# Patient Record
Sex: Male | Born: 1980 | Race: Black or African American | Hispanic: No | Marital: Married | Smoking: Current every day smoker
Health system: Southern US, Community
[De-identification: ages and names within clinical notes are randomized; demographics above are authoritative.]

## PROBLEM LIST (undated history)

## (undated) ENCOUNTER — Ambulatory Visit

---

## 2015-01-13 ENCOUNTER — Emergency Department (HOSPITAL_COMMUNITY)
Admission: EM | Admit: 2015-01-13 | Discharge: 2015-01-13 | Disposition: A | Discharge status: Home / Self Care | Payer: BLUE CROSS/BLUE SHIELD | Attending: Emergency Medicine | Admitting: Emergency Medicine

## 2015-01-13 ENCOUNTER — Encounter (HOSPITAL_COMMUNITY): Payer: Self-pay | Admitting: Emergency Medicine

## 2015-01-13 DIAGNOSIS — K029 Dental caries, unspecified: Secondary | ICD-10-CM | POA: Diagnosis not present

## 2015-01-13 DIAGNOSIS — Z720 Tobacco use: Secondary | ICD-10-CM | POA: Insufficient documentation

## 2015-01-13 DIAGNOSIS — K088 Other specified disorders of teeth and supporting structures: Secondary | ICD-10-CM | POA: Insufficient documentation

## 2015-01-13 DIAGNOSIS — K047 Periapical abscess without sinus: Secondary | ICD-10-CM

## 2015-01-13 MED ORDER — TRAMADOL HCL 50 MG PO TABS: 50.0000 mg | ORAL_TABLET | Freq: Four times a day (QID) | ORAL | Status: DC | PRN

## 2015-01-13 MED ORDER — PENICILLIN V POTASSIUM 500 MG PO TABS
500.0000 mg | ORAL_TABLET | Freq: Once | ORAL | Status: AC
  Administered 2015-01-13: 500 mg via ORAL
  Filled 2015-01-13: qty 1

## 2015-01-13 MED ORDER — PENICILLIN V POTASSIUM 500 MG PO TABS: 500.0000 mg | ORAL_TABLET | Freq: Four times a day (QID) | ORAL | Status: AC

## 2015-01-13 NOTE — ED Notes (Signed)
Pt states he had an abscess to L upper tooth that "burst" earlier today, drained white pus. Pt c/o pain to the area.

## 2015-01-13 NOTE — Discharge Instructions (Signed)
Abscessed Tooth An abscessed tooth is an infection around your tooth. It may be caused by holes or damage to the tooth (cavity) or a dental disease. An abscessed tooth causes mild to very bad pain in and around the tooth. See your dentist right away if you have tooth or gum pain. HOME CARE  Take your medicine as told. Finish it even if you start to feel better.  Do not drive after taking pain medicine.  Rinse your mouth (gargle) often with salt water ( teaspoon salt in 8 ounces of warm water).  Do not apply heat to the outside of your face. GET HELP RIGHT AWAY IF:   You have a temperature by mouth above 102 F (38.9 C), not controlled by medicine.  You have chills and a very bad headache.  You have problems breathing or swallowing.  Your mouth will not open.  You develop puffiness (swelling) on the neck or around the eye.  Your pain is not helped by medicine.  Your pain is getting worse instead of better. MAKE SURE YOU:   Understand these instructions.  Will watch your condition.  Will get help right away if you are not doing well or get worse. Document Released: 04/15/2008 Document Revised: 01/20/2012 Document Reviewed: 02/05/2011 [facility] Patient Information 2015 [place], [place]. This information is not intended to replace advice given to you by your health care provider. Make sure you discuss any questions you have with your health care provider.  Dental Care and Dentist Visits Dental care supports good overall health. Regular dental visits can also help you avoid dental pain, bleeding, infection, and other more serious health problems in the future. It is important to keep the mouth healthy because diseases in the teeth, gums, and other oral tissues can spread to other areas of the body. Some problems, such as diabetes, heart disease, and pre-term labor have been associated with poor oral health.  See your dentist every 6 months. If you experience emergency problems such  as a toothache or broken tooth, go to the dentist right away. If you see your dentist regularly, you may catch problems early. It is easier to be treated for problems in the early stages.  WHAT TO EXPECT AT A DENTIST VISIT  Your dentist will look for many common oral health problems and recommend proper treatment. At your regular dental visit, you can expect:  Gentle cleaning of the teeth and gums. This includes scraping and polishing. This helps to remove the sticky substance around the teeth and gums (plaque). Plaque forms in the mouth shortly after eating. Over time, plaque hardens on the teeth as tartar. If tartar is not removed regularly, it can cause problems. Cleaning also helps remove stains.  Periodic X-rays. These pictures of the teeth and supporting bone will help your dentist assess the health of your teeth.  Periodic fluoride treatments. Fluoride is a natural mineral shown to help strengthen teeth. Fluoride treatmentinvolves applying a fluoride gel or varnish to the teeth. It is most commonly done in children.  Examination of the mouth, tongue, jaws, teeth, and gums to look for any oral health problems, such as:  Cavities (dental caries). This is decay on the tooth caused by plaque, sugar, and acid in the mouth. It is best to catch a cavity when it is small.  Inflammation of the gums caused by plaque buildup (gingivitis).  Problems with the mouth or malformed or misaligned teeth.  Oral cancer or other diseases of the soft tissues or jaws.  KEEP YOUR TEETH AND GUMS HEALTHY For healthy teeth and gums, follow these general guidelines as well as your dentist's specific advice:  Have your teeth professionally cleaned at the dentist every 6 months.  Brush twice daily with a fluoride toothpaste.  Floss your teeth daily.  Ask your dentist if you need fluoride supplements, treatments, or fluoride toothpaste.  Eat a healthy diet. Reduce foods and drinks with added sugar.  Avoid  smoking. TREATMENT FOR ORAL HEALTH PROBLEMS If you have oral health problems, treatment varies depending on the conditions present in your teeth and gums.  Your caregiver will most likely recommend good oral hygiene at each visit.  For cavities, gingivitis, or other oral health disease, your caregiver will perform a procedure to treat the problem. This is typically done at a separate appointment. Sometimes your caregiver will refer you to another dental specialist for specific tooth problems or for surgery. SEEK IMMEDIATE DENTAL CARE IF:  You have pain, bleeding, or soreness in the gum, tooth, jaw, or mouth area.  A permanent tooth becomes loose or separated from the gum socket.  You experience a blow or injury to the mouth or jaw area. Document Released: 07/10/2011 Document Revised: 01/20/2012 Document Reviewed: 07/10/2011 [facility] Patient Information 2015 [place]. This information is not intended to replace advice given to you by your health care provider. Make sure you discuss any questions you have with your health care provider. It is important that you call Dr. Stefani DamaBenitez's office first thing Monday morning, telling them you referred to the emergency department.  I will make every effort to see you within 24 hours of you call

## 2015-01-13 NOTE — ED Provider Notes (Signed)
CSN: [id number]     Arrival date & time 01/13/15  1913 History  This chart was scribed for non-physician practitioner Earley FavorGail Jearldean Gutt NP working with Toy CookeyMegan Docherty, MD by Conchita ParisNadim Abuhashem, ED Scribe. This patient was seen in [facility] and the patient's care was started at 8:12 PM.   Chief Complaint  Patient presents with  . Dental Pain   HPI  HPI Comments: Rodney Hendricks is a 34 y.o. male who presents to the Emergency Department complaining of dental pain ongoing for 3 months in both upper first molars. He woke this morning with an abscess which he drained it himself. He has recently gotten health insurance but has not had made an appointment with a dentist. He is not allergic to any medication.  History reviewed. No pertinent past medical history. History reviewed. No pertinent past surgical history. No family history on file. History  Substance Use Topics  . Smoking status: Current Every Day Smoker -- 0.50 packs/day    Types: Cigarettes  . Smokeless tobacco: Not on file  . Alcohol Use: No    Review of Systems  Constitutional: Negative for fever.  HENT: Positive for dental problem.       Allergies  Review of patient's allergies indicates no known allergies.  Home Medications   Prior to Admission medications   Not on File   BP 146/80 mmHg  Pulse 85  Temp(Src) 98 F (36.7 C) (Oral)  Resp 16  Ht 5\' 11"  (1.803 m)  Wt 168 lb (76.204 kg)  BMI 23.44 kg/m2  SpO2 98% Physical Exam  Constitutional: He appears well-developed and well-nourished.  HENT:  Mouth/Throat: Oropharynx is clear and moist.    Eyes: Pupils are equal, round, and reactive to light.  Neck: Normal range of motion.  Cardiovascular: Normal rate.   Pulmonary/Chest: Effort normal.  Musculoskeletal: Normal range of motion.  Skin: Skin is warm and dry.    ED Course  Procedures  DIAGNOSTIC STUDIES: Oxygen Saturation is 98% on room air, normal by my interpretation.    COORDINATION OF CARE: 8:13 PM  Discussed treatment plan with pt at bedside and pt agreed to plan.  Labs Review Labs Reviewed - No data to display  Imaging Review No results found.   EKG Interpretation None     No facial swelling.  Slight erythema of gum surrounding left upper first molar.  No drainage noted.  Minimal tenderness on palpation.  Start patient on penicillin, Ultram for pain.  Referred to dentist on call MDM   Final diagnoses:  None    I personally performed the services described in this documentation, which was scribed in my presence. The recorded information has been reviewed and is accurate.    Arman FilterGail K Bellarose Burtt, NP 01/13/15 [id number]  Toy CookeyMegan Docherty, MD 01/14/15 [id number]

## 2017-11-18 ENCOUNTER — Encounter (HOSPITAL_COMMUNITY): Payer: Self-pay | Admitting: Emergency Medicine

## 2017-11-18 ENCOUNTER — Emergency Department (HOSPITAL_COMMUNITY)
Admission: EM | Admit: 2017-11-18 | Discharge: 2017-11-18 | Disposition: A | Discharge status: Home / Self Care | Payer: Self-pay | Attending: Emergency Medicine | Admitting: Emergency Medicine

## 2017-11-18 ENCOUNTER — Emergency Department (HOSPITAL_COMMUNITY): Payer: Self-pay

## 2017-11-18 DIAGNOSIS — F17210 Nicotine dependence, cigarettes, uncomplicated: Secondary | ICD-10-CM | POA: Insufficient documentation

## 2017-11-18 DIAGNOSIS — X503XXA Overexertion from repetitive movements, initial encounter: Secondary | ICD-10-CM | POA: Insufficient documentation

## 2017-11-18 DIAGNOSIS — Y9289 Other specified places as the place of occurrence of the external cause: Secondary | ICD-10-CM | POA: Insufficient documentation

## 2017-11-18 DIAGNOSIS — Y9389 Activity, other specified: Secondary | ICD-10-CM | POA: Insufficient documentation

## 2017-11-18 DIAGNOSIS — M25562 Pain in left knee: Secondary | ICD-10-CM | POA: Insufficient documentation

## 2017-11-18 DIAGNOSIS — Y990 Civilian activity done for income or pay: Secondary | ICD-10-CM | POA: Insufficient documentation

## 2017-11-18 MED ORDER — TRAMADOL HCL 50 MG PO TABS: 50.0000 mg | ORAL_TABLET | Freq: Two times a day (BID) | ORAL | 0 refills | Status: DC | PRN

## 2017-11-18 NOTE — ED Provider Notes (Signed)
[name]-EMERGENCY DEPT Provider Note   CSN: [id number] Arrival date & time: 11/18/17  1302     History   Chief Complaint Chief Complaint  Patient presents with  . Knee Pain    left    HPI Rodney Hendricks is a 37 y.o. male presents with acute onset, constant left knee pain  for 5 days.  Patient states that he was at work lifting a box off of a truck while standing on a metal grate.  He states that when he bent down he felt his left knee gave out resulting in acute onset of pain.  Pain is primarily localized to the knee And lateral aspect of the left knee with occasional radiation to the back.  He denies numbness, tingling, or weakness.  He has tried ice, heat, and ibuprofen without significant relief of his symptoms.  He denies head injury or loss of consciousness.  He states he feels that he aggravated his pain even more last night while he was at work.  He states that he works 2 jobs and does a lot of heavy lifting and is on his feet all day.  The history is provided by the patient.    History reviewed. No pertinent past medical history.  There are no active problems to display for this patient.   History reviewed. No pertinent surgical history.     Home Medications    Prior to Admission medications   Medication Sig Start Date End Date Taking? Authorizing Provider  traMADol (ULTRAM) 50 MG tablet Take 1 tablet (50 mg total) by mouth every 12 (twelve) hours as needed for severe pain. 11/18/17   Jeanie SewerFawze, Berneice Zettlemoyer A, PA-C    Family History No family history on file.  Social History Social History   Tobacco Use  . Smoking status: Current Every Day Smoker    Packs/day: 0.50    Types: Cigarettes  . Smokeless tobacco: Never Used  Substance Use Topics  . Alcohol use: No  . Drug use: No     Allergies   Patient has no known allergies.   Review of Systems Review of Systems  Constitutional: Negative for chills and fever.  Musculoskeletal: Positive  for arthralgias (L knee).  Neurological: Negative for syncope, weakness, numbness and headaches.     Physical Exam Updated Vital Signs BP 139/73 (BP Location: Right Arm)   Pulse 85   Temp 98.9 F (37.2 C) (Oral)   Resp 16   SpO2 100%   Physical Exam  Constitutional: He is oriented to person, place, and time. He appears well-developed and well-nourished. No distress.  HENT:  Head: Normocephalic and atraumatic.  Eyes: Conjunctivae are normal. Right eye exhibits no discharge. Left eye exhibits no discharge.  Neck: No JVD present. No tracheal deviation present.  Cardiovascular: Normal rate and intact distal pulses.  2+ DP/PT pulses bilaterally, no lower extremity edema  Pulmonary/Chest: Effort normal.  Abdominal: He exhibits no distension.  Musculoskeletal: He exhibits tenderness. He exhibits no edema.       Right knee: Normal.       Left knee: He exhibits decreased range of motion and bony tenderness. He exhibits no swelling, no effusion, no ecchymosis, no deformity, no laceration, no erythema, normal alignment, no LCL laxity, normal patellar mobility, normal meniscus and no MCL laxity. Tenderness found. Medial joint line, lateral joint line and LCL tenderness noted.       Right ankle: Normal.       Left ankle: Normal.  Lumbar back: Normal.  Slightly decreased range of motion with flexion of the left knee secondary to pain.  Able to extend the left knee against gravity without difficulty. negative anterior/posterior drawer test.  No varus or valgus instability.  5/5 strength of BLE major muscle groups. No midline spine TTP, no paraspinal muscle tenderness, no deformity, crepitus, or step-off noted.   Neurological: He is alert and oriented to person, place, and time. No cranial nerve deficit or sensory deficit. He exhibits normal muscle tone.  Fluent speech, no facial droop, sensation intact to soft touch of bilateral lower extremities.  Antalgic gait due to left knee pain but able  to Heel Walk and Toe Walk without difficulty  Skin: Skin is warm and dry. No erythema.  Psychiatric: He has a normal mood and affect. His behavior is normal.  Nursing note and vitals reviewed.    ED Treatments / Results  Labs (all labs ordered are listed, but only abnormal results are displayed) Labs Reviewed - No data to display  EKG  EKG Interpretation None       Radiology Dg Knee Complete 4 Views Left  Result Date: 11/18/2017 CLINICAL DATA:  Knee pain EXAM: LEFT KNEE - COMPLETE 4+ VIEW COMPARISON:  None. FINDINGS: No evidence of fracture, dislocation, or joint effusion. No evidence of arthropathy or other focal bone abnormality. Soft tissues are unremarkable. IMPRESSION: Negative. Electronically Signed   By: Marlan Palau M.D.   On: 11/18/2017 14:18    Procedures Procedures (including critical care time)  Medications Ordered in ED Medications - No data to display   Initial Impression / Assessment and Plan / ED Course  I have reviewed the triage vital signs and the nursing notes.  Pertinent labs & imaging results that were available during my care of the patient were reviewed by me and considered in my medical decision making (see chart for details).     Patient with left knee pain secondary to injury 5 days ago at work.  No head injury or loss of consciousness.  Afebrile, vital signs are stable.  He is nontoxic in appearance.  He is neurovascularly intact.  He is ambulatory although it is somewhat painful.  No evidence of quadriceps tendon rupture.  No erythema or constitutional symptoms to suggest septic joint and he has good range of motion although somewhat painful with flexion.  Radiographs show no fracture or dislocation.  RICE therapy indicated and discussed with patient.  He will follow-up with his primary care physician or orthopedics for reevaluation of symptoms.  Will give a small prescription for tramadol for severe breakthrough pain and advised patient of the  appropriate use of this medication and that it may make him drowsy.  Discussed indications for return to the ED. Pt verbalized understanding of and agreement with plan and is safe for discharge home at this time.  He has no complaints prior to discharge.  Final Clinical Impressions(s) / ED Diagnoses   Final diagnoses:  Acute pain of left knee    ED Discharge Orders        Ordered    traMADol (ULTRAM) 50 MG tablet  Every 12 hours PRN     11/18/17 1424       Jeanie Sewer, PA-C 11/18/17 1644    Derwood Kaplan, MD 11/19/17 [phone]

## 2017-11-18 NOTE — ED Triage Notes (Signed)
Patient c/o left knee pain since Friday when working at [employer] and pulled load off truck. Patient c/o pain continued even with heat and ice.

## 2017-11-18 NOTE — Discharge Instructions (Signed)
1. Medications: Alternate 600 mg of ibuprofen and [phone] mg of Tylenol every 3 hours as needed for pain. Do not exceed 4000 mg of Tylenol daily.  You may take tramadol as needed for severe pain but do not drive, drink alcohol, or operate heavy machinery on this medication as it may make you drowsy. 2. Treatment: rest, ice, elevate and use brace and crutches for comfort, drink plenty of fluids, gentle stretching 3. Follow Up: Please followup with orthopedics as directed or your PCP in 1 week if no improvement for discussion of your diagnoses and further evaluation after today's visit; if you do not have a primary care doctor use the resource guide provided to find one; Please return to the ER for worsening symptoms or other concerns

## 2018-03-12 ENCOUNTER — Encounter (HOSPITAL_COMMUNITY): Payer: Self-pay

## 2018-03-12 ENCOUNTER — Emergency Department (HOSPITAL_COMMUNITY)
Admission: EM | Admit: 2018-03-12 | Discharge: 2018-03-12 | Disposition: A | Discharge status: Home / Self Care | Payer: Self-pay | Attending: Emergency Medicine | Admitting: Emergency Medicine

## 2018-03-12 ENCOUNTER — Other Ambulatory Visit: Payer: Self-pay

## 2018-03-12 DIAGNOSIS — K029 Dental caries, unspecified: Secondary | ICD-10-CM | POA: Insufficient documentation

## 2018-03-12 DIAGNOSIS — K0889 Other specified disorders of teeth and supporting structures: Secondary | ICD-10-CM | POA: Insufficient documentation

## 2018-03-12 DIAGNOSIS — F17210 Nicotine dependence, cigarettes, uncomplicated: Secondary | ICD-10-CM | POA: Insufficient documentation

## 2018-03-12 MED ORDER — MELOXICAM 15 MG PO TABS: 15.0000 mg | ORAL_TABLET | Freq: Every day | ORAL | 0 refills | Status: DC

## 2018-03-12 MED ORDER — PENICILLIN V POTASSIUM 500 MG PO TABS: 500.0000 mg | ORAL_TABLET | Freq: Four times a day (QID) | ORAL | 0 refills | Status: AC

## 2018-03-12 NOTE — ED Provider Notes (Signed)
[facility]-EMERGENCY DEPT Provider Note   CSN: [id number] Arrival date & time: 03/12/18  1604     History   Chief Complaint Chief Complaint  Patient presents with  . Dental Pain    HPI Rodney Hendricks is a 37 y.o. male who presents to ED for evaluation of 2-day history of left upper dental pain.  Describes the pain as sharp and radiates to the entire left side of his face.  He has tried Tylenol, NSAIDs, turmeric oil with no improvement in his symptoms.  Believes the pain began after eating a hard food which caused his tooth to be chipped.  He has not seen a dentist in 1 year.  He denies any trouble breathing or trouble swallowing, fevers, drainage or bleeding from site, drooling, trismus, shortness of breath, neck pain or rashes.  HPI  History reviewed. No pertinent past medical history.  There are no active problems to display for this patient.   History reviewed. No pertinent surgical history.      Home Medications    Prior to Admission medications   Medication Sig Start Date End Date Taking? Authorizing Provider  meloxicam (MOBIC) 15 MG tablet Take 1 tablet (15 mg total) by mouth daily. 03/12/18   Daelyn Pettaway, PA-C  penicillin v potassium (VEETID) 500 MG tablet Take 1 tablet (500 mg total) by mouth 4 (four) times daily for 7 days. 03/12/18 03/19/18  Jovoni Borkenhagen, PA-C  traMADol (ULTRAM) 50 MG tablet Take 1 tablet (50 mg total) by mouth every 12 (twelve) hours as needed for severe pain. 11/18/17   Jeanie Sewer, PA-C    Family History History reviewed. No pertinent family history.  Social History Social History   Tobacco Use  . Smoking status: Current Every Day Smoker    Packs/day: 0.50    Types: Cigarettes  . Smokeless tobacco: Never Used  Substance Use Topics  . Alcohol use: No  . Drug use: No     Allergies   Patient has no known allergies.   Review of Systems Review of Systems  Constitutional: Negative for chills and fever.  HENT:  Positive for dental problem. Negative for ear pain, facial swelling, mouth sores, rhinorrhea and sore throat.   Respiratory: Negative for shortness of breath.   Cardiovascular: Negative for chest pain.  Gastrointestinal: Negative for vomiting.     Physical Exam Updated Vital Signs BP 134/82 (BP Location: Left Arm)   Pulse 72   Temp 98.3 F (36.8 C) (Oral)   Resp 16   Ht  (1.753 m)   Wt 74.8 kg (165 lb)   SpO2 97%   BMI 24.37 kg/m   Physical Exam  Constitutional: He appears well-developed and well-nourished. No distress.  HENT:  Head: Normocephalic and atraumatic.  Nose: Nose normal.  Mouth/Throat: Uvula is midline and oropharynx is clear and moist. He does not have dentures. No oral lesions. No trismus in the jaw. Abnormal dentition. Dental caries present. No dental abscesses, uvula swelling or lacerations. No tonsillar exudate.    Overall poor dentition noted.  2 chipped teeth noted in the area.  No gross dental abscess or site of drainage at this time.  Mild inflammation of the gum noted. No facial, neck or cheek swelling noted. No pooling of secretions or trismus.  Normal voice noted with no difficulty swallowing or breathing.  No submandibular edema, erythema or crepitus noted.  Eyes: Conjunctivae and EOM are normal. No scleral icterus.  Neck: Normal range of motion.  Pulmonary/Chest: Effort normal. No respiratory distress.  Neurological: He is alert.  Skin: No rash noted. He is not diaphoretic.  Psychiatric: He has a normal mood and affect.  Nursing note and vitals reviewed.    ED Treatments / Results  Labs (all labs ordered are listed, but only abnormal results are displayed) Labs Reviewed - No data to display  EKG None  Radiology No results found.  Procedures Procedures (including critical care time)  Medications Ordered in ED Medications - No data to display   Initial Impression / Assessment and Plan / ED Course  I have reviewed the triage vital  signs and the nursing notes.  Pertinent labs & imaging results that were available during my care of the patient were reviewed by me and considered in my medical decision making (see chart for details).     Patient with dentalgia. On exam, there is no evidence of a drainable abscess. No trismus, glossal elevation, unilateral tonsillar swelling. No evidence of retropharyngeal or peritonsillar abscess or Ludwig angina. Will treat with  anti-inflammatories and antibiotics. Pt instructed to follow-up with dentist as soon as possible.   Advised to return for any severe worsening symptoms.  Portions of this note were generated with Scientist, clinical (histocompatibility and immunogenetics). Dictation errors may occur despite best attempts at proofreading.   Final Clinical Impressions(s) / ED Diagnoses   Final diagnoses:  Pain, dental    ED Discharge Orders        Ordered    penicillin v potassium (VEETID) 500 MG tablet  4 times daily     03/12/18 1725    meloxicam (MOBIC) 15 MG tablet  Daily     03/12/18 1725       Dietrich Pates, PA-C 03/12/18 1733    Alvira Monday, MD 03/14/18 1229

## 2018-03-12 NOTE — ED Triage Notes (Signed)
Pt reports upper L dental pain x1 day. He has tried various essential oils and advil for pain without relief. Unsure about fever. A&Ox4. Ambulatory.

## 2018-07-03 IMAGING — CR DG KNEE COMPLETE 4+V*L*
4 series · 4 of 4 positions shown · non-contrast
Comparison: None.

CLINICAL DATA: Knee pain

EXAM:
LEFT KNEE - COMPLETE 4+ VIEW

[x knee ap left (1 of 3)]
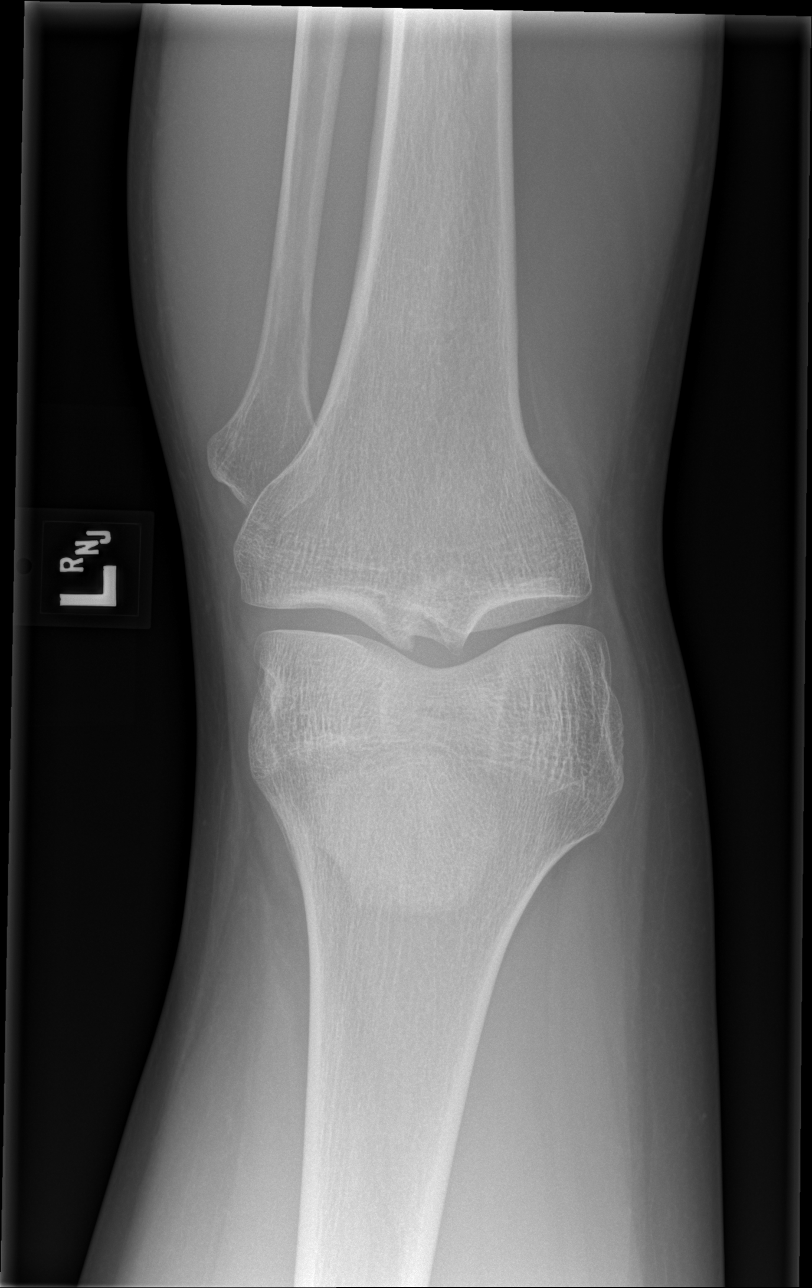

[x knee ap left (2 of 3)]
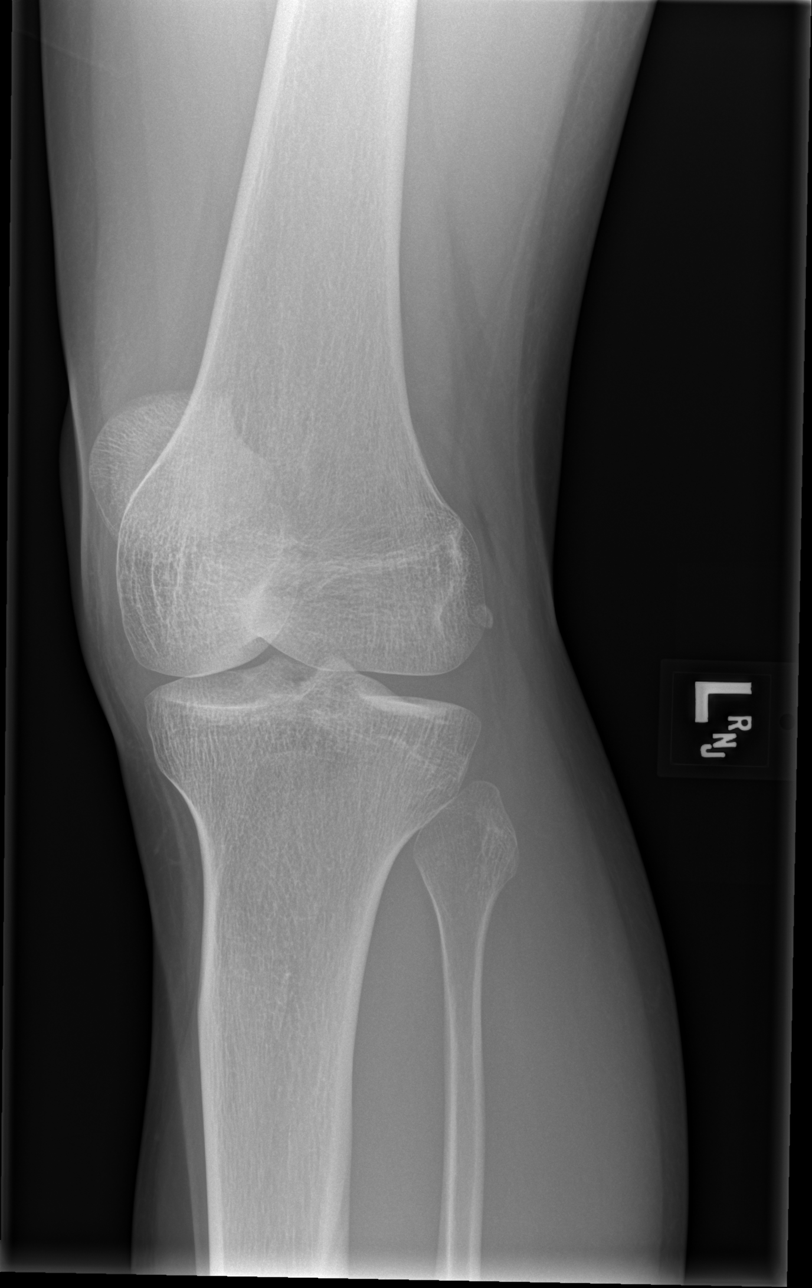

[x knee ap left (3 of 3)]
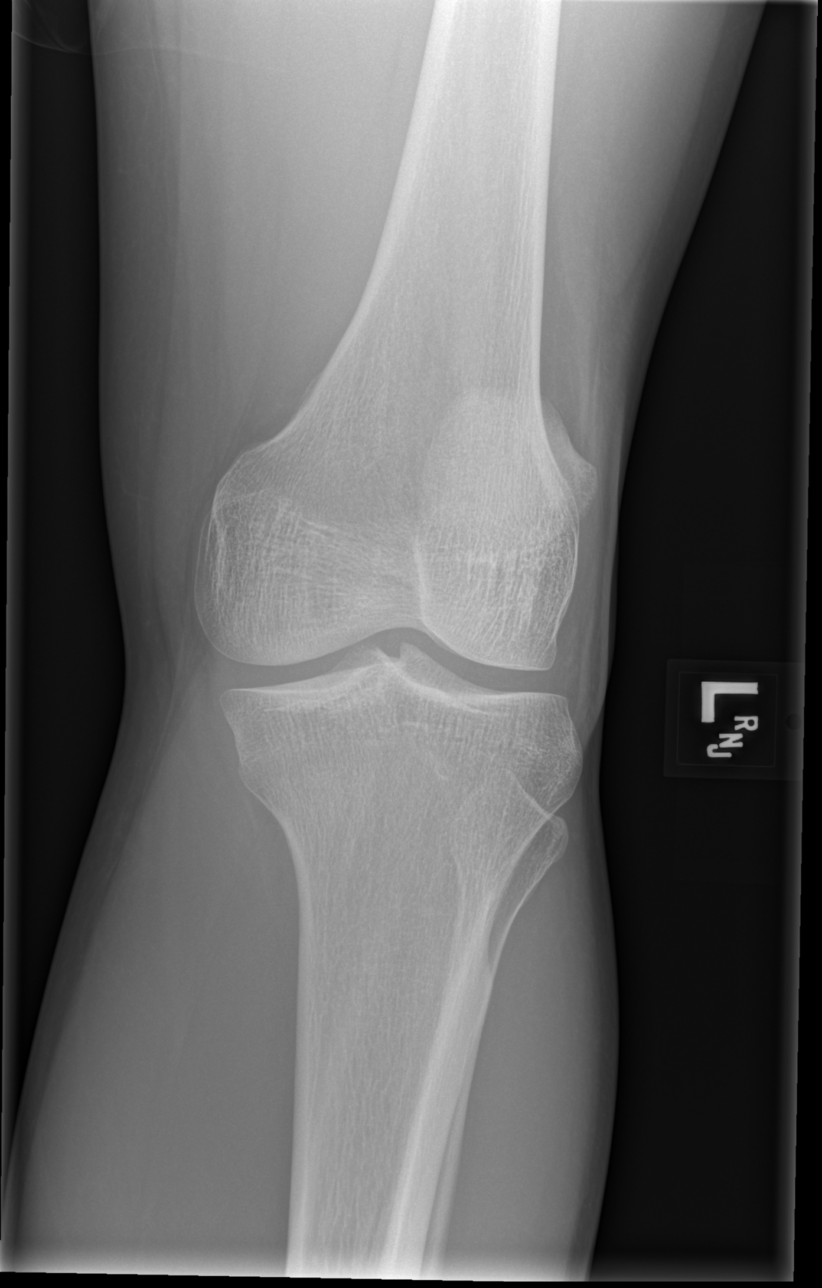

[x knee lat left]
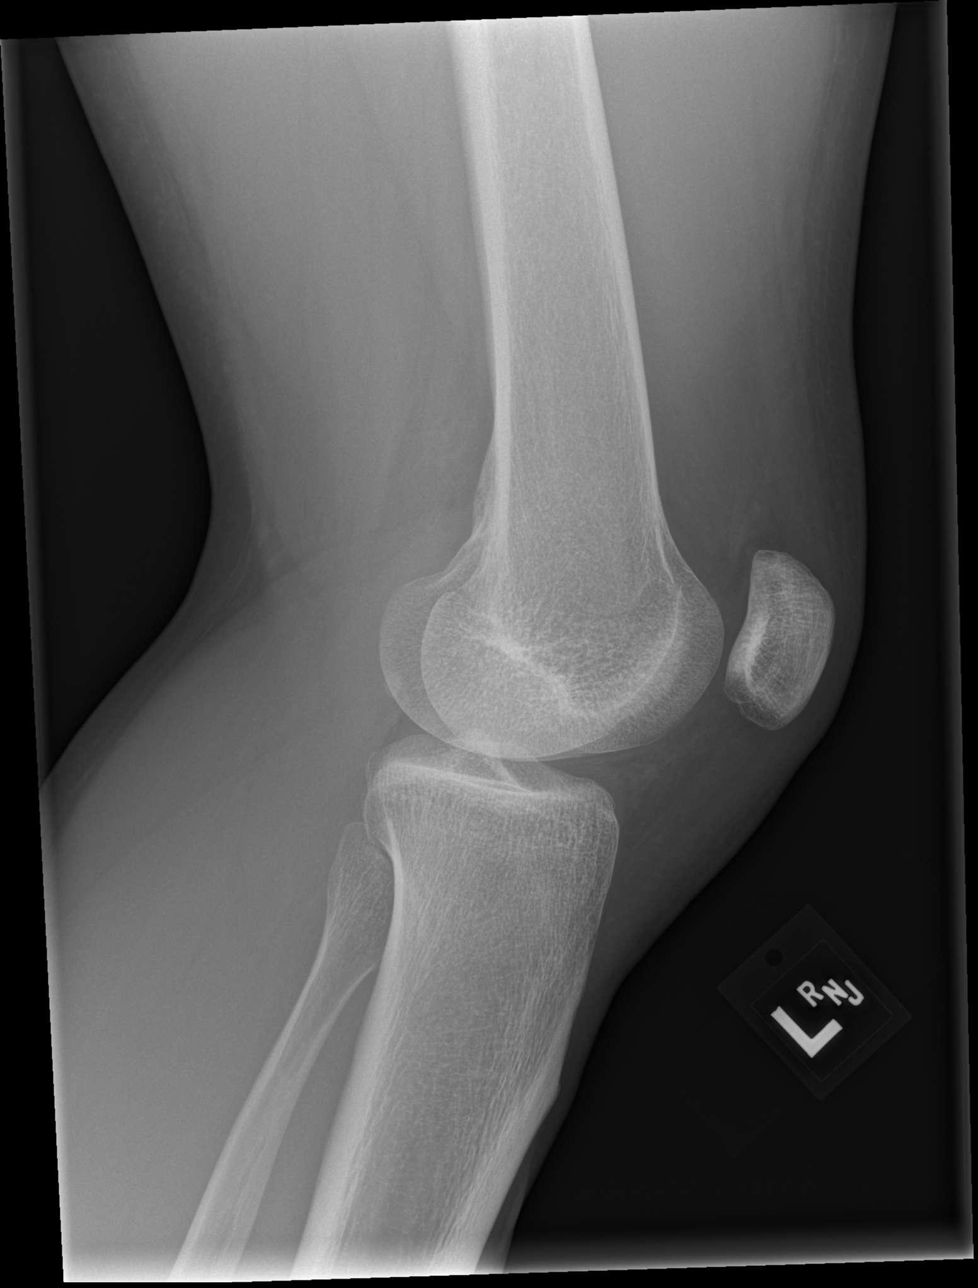

[4 of 4 positions shown; findings below may reference images not displayed]

FINDINGS: No evidence of fracture, dislocation, or joint effusion. No evidence
of arthropathy or other focal bone abnormality. Soft tissues are
unremarkable.
IMPRESSION: Negative.

## 2019-06-16 ENCOUNTER — Other Ambulatory Visit: Payer: Self-pay

## 2019-06-16 DIAGNOSIS — Z20822 Contact with and (suspected) exposure to covid-19: Secondary | ICD-10-CM

## 2019-06-18 ENCOUNTER — Telehealth: Payer: Self-pay | Admitting: General Practice

## 2019-06-18 LAB — NOVEL CORONAVIRUS, NAA: SARS-CoV-2, NAA: NOT DETECTED

## 2019-06-18 NOTE — Telephone Encounter (Signed)
Patient informed of negative covid-19 result. Patient verbalized understanding.  °

## 2019-10-09 ENCOUNTER — Other Ambulatory Visit: Payer: Self-pay

## 2019-10-09 ENCOUNTER — Ambulatory Visit (INDEPENDENT_AMBULATORY_CARE_PROVIDER_SITE_OTHER): Payer: Managed Care, Other (non HMO)

## 2019-10-09 ENCOUNTER — Encounter: Payer: Self-pay | Admitting: Physician Assistant

## 2019-10-09 ENCOUNTER — Ambulatory Visit
Admission: EM | Admit: 2019-10-09 | Discharge: 2019-10-09 | Disposition: A | Discharge status: Home / Self Care | Payer: Self-pay | Source: Home / Self Care

## 2019-10-09 ENCOUNTER — Ambulatory Visit
Admission: EM | Admit: 2019-10-09 | Discharge: 2019-10-09 | Disposition: A | Discharge status: Home / Self Care | Payer: Managed Care, Other (non HMO) | Attending: Physician Assistant | Admitting: Physician Assistant

## 2019-10-09 DIAGNOSIS — Y93K1 Activity, walking an animal: Secondary | ICD-10-CM

## 2019-10-09 DIAGNOSIS — W010XXA Fall on same level from slipping, tripping and stumbling without subsequent striking against object, initial encounter: Secondary | ICD-10-CM | POA: Diagnosis not present

## 2019-10-09 DIAGNOSIS — M25572 Pain in left ankle and joints of left foot: Secondary | ICD-10-CM

## 2019-10-09 MED ORDER — MELOXICAM 7.5 MG PO TABS: 7.5000 mg | ORAL_TABLET | Freq: Every day | ORAL | 0 refills | Status: AC

## 2019-10-09 NOTE — ED Provider Notes (Signed)
[facility]    CSN: [id number] Arrival date & time: 10/09/19  1309      History   Chief Complaint Chief Complaint  Patient presents with  . Ankle Pain    HPI Rodney Hendricks is a 38 y.o. male.   38 year old male comes in for 2-day history of left ankle pain.  Patient slipped and fell yesterday while walking dog.  States he inverted ankle during the fall.  Has swelling, pain, and has had trouble ambulating since the fall.  Pain is most significant to the lateral ankle, but has diffuse pain to the ankle.  Denies numbness, tingling.  Has been doing ibuprofen, Tylenol, ice compress without relief.     History reviewed. No pertinent past medical history.  There are no active problems to display for this patient.   History reviewed. No pertinent surgical history.     Home Medications    Prior to Admission medications   Medication Sig Start Date End Date Taking? Authorizing Provider  meloxicam (MOBIC) 7.5 MG tablet Take 1 tablet (7.5 mg total) by mouth daily. 10/09/19   Belinda Fisher, PA-C    Family History No family history on file.  Social History Social History   Tobacco Use  . Smoking status: Current Every Day Smoker    Packs/day: 0.50    Types: Cigarettes  . Smokeless tobacco: Never Used  Substance Use Topics  . Alcohol use: No  . Drug use: No     Allergies   Patient has no known allergies.   Review of Systems Review of Systems  Reason unable to perform ROS: See HPI as above.     Physical Exam Triage Vital Signs ED Triage Vitals [10/09/19 1312]  Enc Vitals Group     BP 125/72     Pulse Rate 81     Resp 14     Temp 98.5 F (36.9 C)     Temp Source Oral     SpO2 98 %     Weight      Height      Head Circumference      Peak Flow      Pain Score      Pain Loc      Pain Edu?      Excl. in GC?    No data found.  Updated Vital Signs BP 125/72 (BP Location: Left Arm)   Pulse 81   Temp 98.5 F (36.9 C) (Oral)   Resp 14    SpO2 98%   Visual Acuity Right Eye Distance:   Left Eye Distance:   Bilateral Distance:    Right Eye Near:   Left Eye Near:    Bilateral Near:     Physical Exam Constitutional:      General: He is not in acute distress.    Appearance: He is well-developed. He is not diaphoretic.  HENT:     Head: Normocephalic and atraumatic.  Eyes:     Conjunctiva/sclera: Conjunctivae normal.     Pupils: Pupils are equal, round, and reactive to light.  Pulmonary:     Effort: Pulmonary effort is normal. No respiratory distress.  Musculoskeletal:     Comments: Swelling with contusion to the lateral left ankle, distal lower leg.  Tenderness to palpation along lateral distal leg, ankle, proximal foot.  Diffuse tenderness to palpation of dorsal and medial aspect of ankle.  Decreased range of motion.  Strength deferred.  Sensation intact and equal bilaterally.  Pedal pulse 2+.  Skin:    General: Skin is warm and dry.  Neurological:     Mental Status: He is alert and oriented to person, place, and time.      UC Treatments / Results  Labs (all labs ordered are listed, but only abnormal results are displayed) Labs Reviewed - No data to display  EKG   Radiology Dg Ankle Complete Left  Result Date: 10/09/2019 CLINICAL DATA:  Pain following inversion type injury EXAM: LEFT ANKLE COMPLETE - 3+ VIEW COMPARISON:  None. FINDINGS: Frontal, oblique, and lateral views were obtained. There is soft tissue swelling. There is no demonstrable acute fracture. A well corticated calcification focus in the lateral malleolus probably represents residua of old trauma. There is no appreciable joint effusion. There is no joint space narrowing or erosion. Ankle mortise appears intact. There is a small inferior calcaneal spur. IMPRESSION: Soft tissue swelling. No acute fracture evident. Suspect old trauma lateral malleolar region. No appreciable joint space narrowing or erosion. Ankle mortise appears intact. Small  calcaneal spur inferiorly. Electronically Signed   By: Lowella Grip III M.D.   On: 10/09/2019 14:07    Procedures Procedures (including critical care time)  Medications Ordered in UC Medications - No data to display  Initial Impression / Assessment and Plan / UC Course  I have reviewed the triage vital signs and the nursing notes.  Pertinent labs & imaging results that were available during my care of the patient were reviewed by me and considered in my medical decision making (see chart for details).    X-ray negative for fracture or dislocation.  NSAIDs, ice compress, elevation, CAM Walker during activity.  Patient to continue to monitor range of motion, if pain significantly improved, but still unable to move ankle, to follow-up with orthopedic for further evaluation.  Return precautions given.  Patient expresses understanding and agrees to plan.  Final Clinical Impressions(s) / UC Diagnoses   Final diagnoses:  Acute left ankle pain   ED Prescriptions    Medication Sig Dispense Auth. Provider   meloxicam (MOBIC) 7.5 MG tablet Take 1 tablet (7.5 mg total) by mouth daily. 10 tablet Ok Edwards, PA-C     PDMP not reviewed this encounter.   Ok Edwards, PA-C 10/09/19 1500

## 2019-10-09 NOTE — ED Triage Notes (Signed)
Seen by provider

## 2019-10-09 NOTE — Discharge Instructions (Signed)
X-ray negative for fracture or dislocation. Start Mobic. Do not take ibuprofen (motrin/advil)/ naproxen (aleve) while on mobic.  Tylenol to supplement for pain.  Ice compress, elevation, rest, CAM Walker during activity.  This may take a few weeks to completely resolve, but should be feeling better each week.  Follow-up with orthopedic for further evaluation if symptoms not improving.

## 2019-10-09 NOTE — ED Notes (Signed)
Patient transported to X-ray 

## 2020-05-23 IMAGING — DX DG ANKLE COMPLETE 3+V*L*
3 series · 3 of 3 positions shown · non-contrast
Comparison: None.

CLINICAL DATA: Pain following inversion type injury

EXAM:
LEFT ANKLE COMPLETE - 3+ VIEW

[ankle ap]
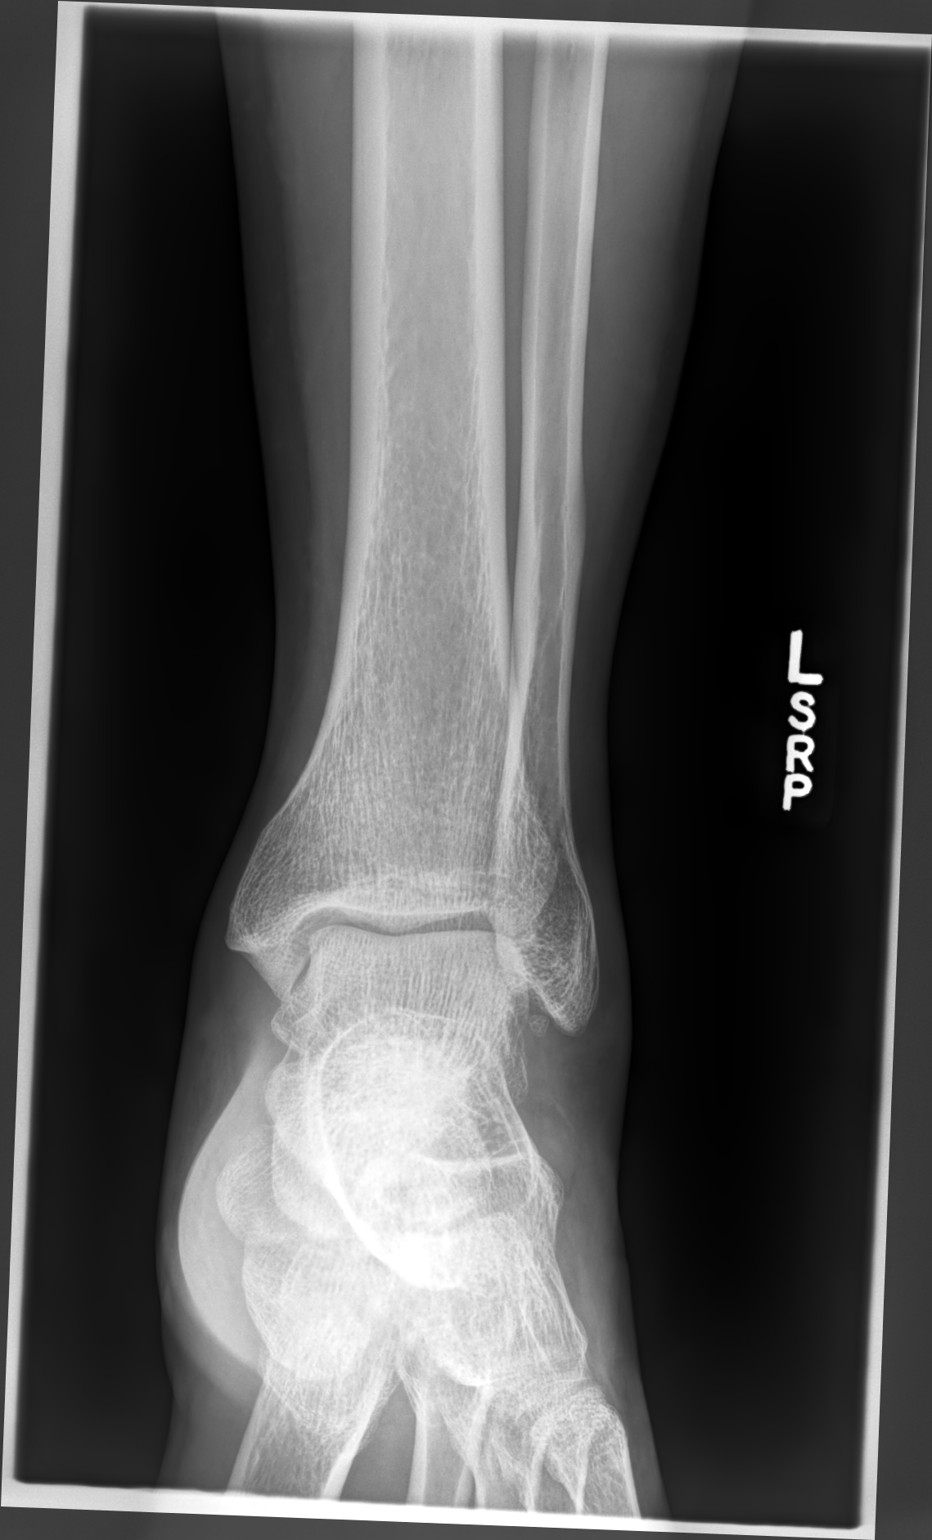

[ankle medial oblique]
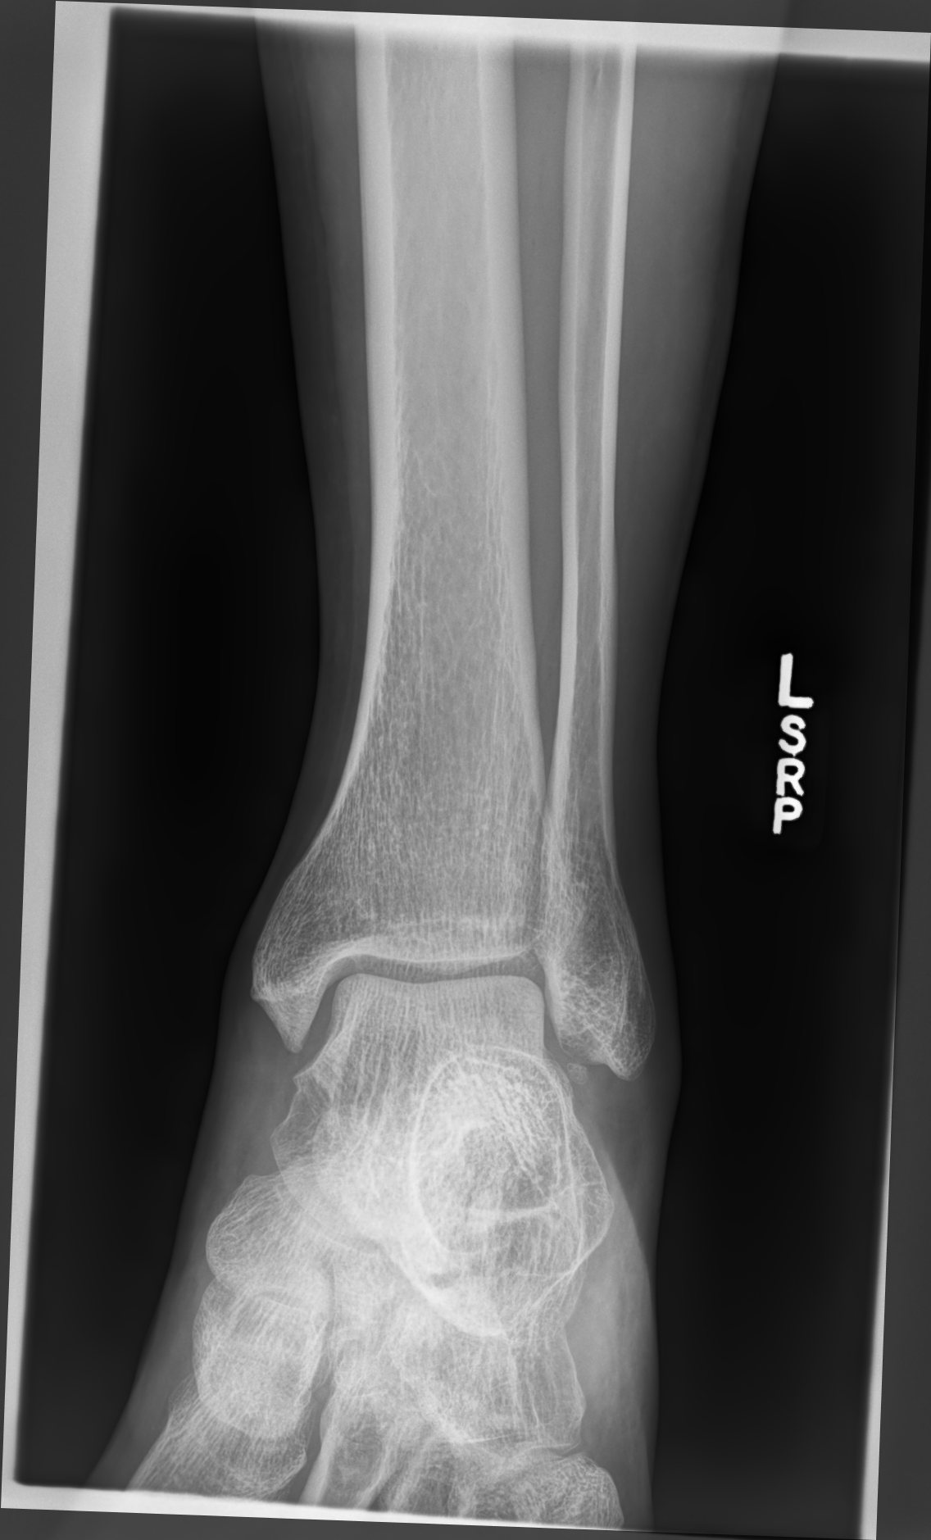

[ankle lat]
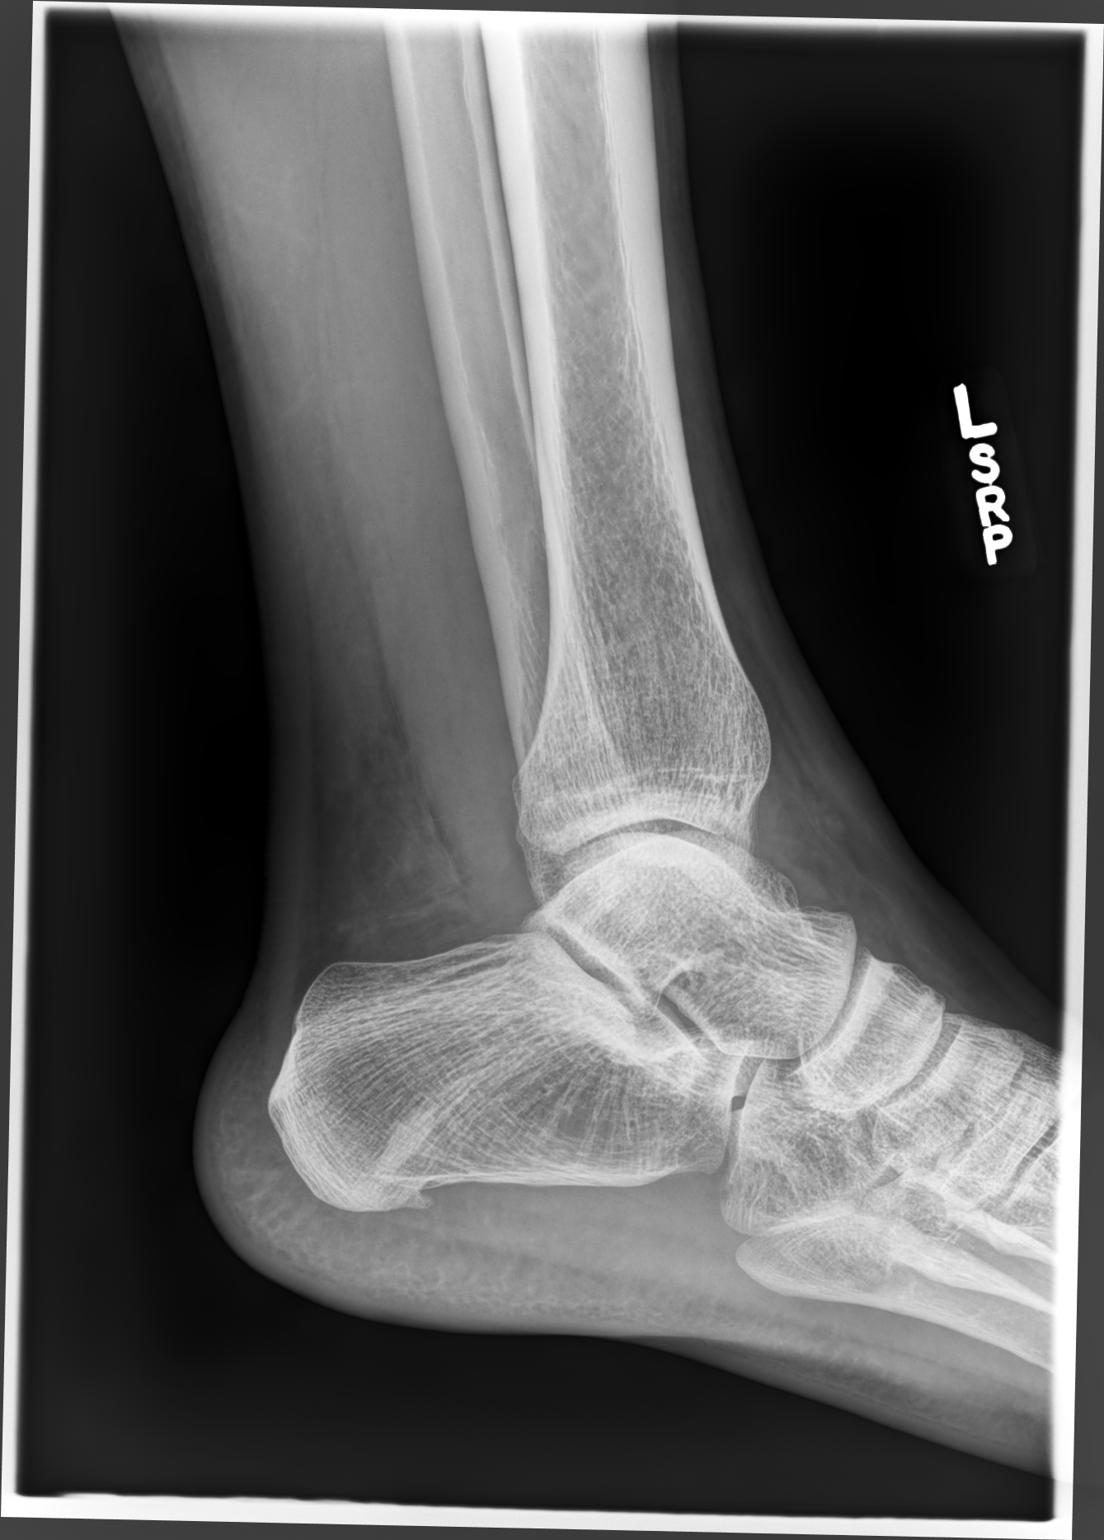

[3 of 3 positions shown; findings below may reference images not displayed]

FINDINGS: Frontal, oblique, and lateral views were obtained. There is soft
tissue swelling. There is no demonstrable acute fracture. A well
corticated calcification focus in the lateral malleolus probably
represents residua of old trauma. There is no appreciable joint
effusion. There is no joint space narrowing or erosion. Ankle
mortise appears intact. There is a small inferior calcaneal spur.
IMPRESSION: Soft tissue swelling. No acute fracture evident. Suspect old trauma
lateral malleolar region. No appreciable joint space narrowing or
erosion. Ankle mortise appears intact. Small calcaneal spur
inferiorly.

## 2022-06-14 ENCOUNTER — Ambulatory Visit
Admission: EM | Admit: 2022-06-14 | Discharge: 2022-06-14 | Disposition: A | Discharge status: Home / Self Care | Payer: Managed Care, Other (non HMO) | Attending: Physician Assistant | Admitting: Physician Assistant

## 2022-06-14 DIAGNOSIS — Z113 Encounter for screening for infections with a predominantly sexual mode of transmission: Secondary | ICD-10-CM | POA: Diagnosis not present

## 2022-06-14 NOTE — ED Triage Notes (Signed)
Pt presents for full STD Testing after having a rash around penis X 2 months.

## 2022-06-14 NOTE — ED Provider Notes (Signed)
[facility]    CSN: [id number] Arrival date & time: 06/14/22  1002      History   Chief Complaint Chief Complaint  Patient presents with   STD Testing     HPI Rodney Hendricks is a 41 y.o. male.   Patient here today for STD screening. He denies any current symptoms other than a rash to his penis that has been present for the last 2 months. He reports rash is not itchy or significantly painful. He has not had any known exposures. He has not history of herpes. He does not report treatment for symptoms.  The history is provided by the patient.    History reviewed. No pertinent past medical history.  There are no problems to display for this patient.   History reviewed. No pertinent surgical history.     Home Medications    Prior to Admission medications   Medication Sig Start Date End Date Taking? Authorizing Provider  meloxicam (MOBIC) 7.5 MG tablet Take 1 tablet (7.5 mg total) by mouth daily. 10/09/19   Belinda Fisher, PA-C    Family History Family History  Family history unknown: Yes    Social History Social History   Tobacco Use   Smoking status: Every Day    Packs/day: 0.50    Types: Cigarettes   Smokeless tobacco: Never  Vaping Use   Vaping Use: Never used  Substance Use Topics   Alcohol use: No   Drug use: No     Allergies   Patient has no known allergies.   Review of Systems Review of Systems  Constitutional:  Negative for chills and fever.  Eyes:  Negative for discharge and redness.  Genitourinary:  Positive for genital sores. Negative for dysuria and penile discharge.     Physical Exam Triage Vital Signs ED Triage Vitals  Enc Vitals Group     BP      Pulse      Resp      Temp      Temp src      SpO2      Weight      Height      Head Circumference      Peak Flow      Pain Score      Pain Loc      Pain Edu?      Excl. in GC?    No data found.  Updated Vital Signs BP 136/77 (BP Location: Left Arm)   Pulse 72    Temp 98 F (36.7 C) (Oral)   Resp 18   SpO2 97%      Physical Exam Vitals and nursing note reviewed.  Constitutional:      General: He is not in acute distress.    Appearance: Normal appearance. He is not ill-appearing.  HENT:     Head: Normocephalic and atraumatic.  Eyes:     Conjunctiva/sclera: Conjunctivae normal.  Cardiovascular:     Rate and Rhythm: Normal rate.  Pulmonary:     Effort: Pulmonary effort is normal.  Genitourinary:    Comments: Chaperone present: Melissa White, CMA  Mildly erythematous papular lesions, some excoriated to distal penis, no active bleeding or drainage Neurological:     Mental Status: He is alert.  Psychiatric:        Mood and Affect: Mood normal.        Behavior: Behavior normal.        Thought Content: Thought content normal.  UC Treatments / Results  Labs (all labs ordered are listed, but only abnormal results are displayed) Labs Reviewed  HIV ANTIBODY (ROUTINE TESTING W REFLEX)  HEPATITIS PANEL, ACUTE  RPR  HSV 2 ANTIBODY, IGG  CYTOLOGY, (ORAL, ANAL, URETHRAL) ANCILLARY ONLY    EKG   Radiology No results found.  Procedures Procedures (including critical care time)  Medications Ordered in UC Medications - No data to display  Initial Impression / Assessment and Plan / UC Course  I have reviewed the triage vital signs and the nursing notes.  Pertinent labs & imaging results that were available during my care of the patient were reviewed by me and considered in my medical decision making (see chart for details).   STD screening ordered as requested. Will await results for further recommendation. Encouraged follow up with any further concern.   Final Clinical Impressions(s) / UC Diagnoses   Final diagnoses:  Screening for STD (sexually transmitted disease)   Discharge Instructions   None    ED Prescriptions   None    PDMP not reviewed this encounter.   Tomi Bamberger, PA-C 06/14/22 1042

## 2022-06-15 LAB — HIV ANTIBODY (ROUTINE TESTING W REFLEX): HIV Screen 4th Generation wRfx: NONREACTIVE

## 2022-06-15 LAB — HSV 2 ANTIBODY, IGG: HSV 2 IgG, Type Spec: <0.91 index (ref 0.00–0.90)

## 2022-06-15 LAB — RPR: RPR Ser Ql: NONREACTIVE

## 2022-06-17 LAB — CYTOLOGY, (ORAL, ANAL, URETHRAL) ANCILLARY ONLY
Chlamydia: NEGATIVE
Comment: NEGATIVE
Comment: NEGATIVE
Comment: NORMAL
Neisseria Gonorrhea: NEGATIVE
Trichomonas: NEGATIVE

## 2024-09-18 ENCOUNTER — Ambulatory Visit

## 2024-09-18 ENCOUNTER — Ambulatory Visit: Admission: EM | Admit: 2024-09-18 | Discharge: 2024-09-18 | Disposition: A | Discharge status: Home / Self Care

## 2024-09-18 DIAGNOSIS — S6992XA Unspecified injury of left wrist, hand and finger(s), initial encounter: Secondary | ICD-10-CM | POA: Diagnosis not present

## 2024-09-18 DIAGNOSIS — Z113 Encounter for screening for infections with a predominantly sexual mode of transmission: Secondary | ICD-10-CM | POA: Diagnosis not present

## 2024-09-18 NOTE — ED Provider Notes (Signed)
 [facility]    CSN: [id number] Arrival date & time: 09/18/24  1114      History   Chief Complaint Chief Complaint  Patient presents with   Injury    HPI Rodney Hendricks is a 43 y.o. male.   43 year old male who presents urgent care with complaints of pain and swelling in the left ring finger.  He reports that about 3 days ago he was moving a washer dryer unit and smashed his finger underneath.  He has continued to have persistent pain and swelling.  He reports that he is able to move the finger better today.  He wanted to see if there was any fractures or anything else that need to be done.  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   Injury Pertinent negatives include no chest pain, no abdominal pain and no shortness of breath.    History reviewed. No pertinent past medical history.  There are no active problems to display for this patient.   History reviewed. No pertinent surgical history.     Home Medications    Prior to Admission medications   Medication Sig Start Date End Date Taking? Authorizing Provider  acetaminophen (TYLENOL) 500 MG tablet Take 500 mg by mouth every 6 (six) hours as needed.   Yes [provider]  meloxicam  (MOBIC ) 7.5 MG tablet Take 1 tablet (7.5 mg total) by mouth daily. 10/09/19   Babara Greig GAILS, PA-C    Family History Family History  Family history unknown: Yes    Social History Social History   Tobacco Use   Smoking status: Every Day    Current packs/day: 0.50    Types: Cigarettes   Smokeless tobacco: Never  Vaping Use   Vaping status: Never Used  Substance Use Topics   Alcohol use: No   Drug use: Not Currently     Allergies   Patient has no known allergies.   Review of Systems Review of Systems  Constitutional:  Negative for chills and fever.  HENT:  Negative for ear pain and sore throat.   Eyes:  Negative for pain and visual disturbance.  Respiratory:  Negative  for cough and shortness of breath.   Cardiovascular:  Negative for chest pain and palpitations.  Gastrointestinal:  Negative for abdominal pain and vomiting.  Genitourinary:  Negative for dysuria and hematuria.  Musculoskeletal:  Negative for arthralgias and back pain.       Left ring finger pain and swelling  Skin:  Negative for color change and rash.  Neurological:  Negative for seizures and syncope.  All other systems reviewed and are negative.    Physical Exam Triage Vital Signs ED Triage Vitals  Encounter Vitals Group     BP 09/18/24 1210 121/77     Girls Systolic BP Percentile --      Girls Diastolic BP Percentile --      Boys Systolic BP Percentile --      Boys Diastolic BP Percentile --      Pulse Rate 09/18/24 1210 83     Resp 09/18/24 1210 18     Temp 09/18/24 1210 97.8 F (36.6 C)     Temp Source 09/18/24 1210 Oral     SpO2 09/18/24 1210 98 %     Weight 09/18/24 1208 164 lb 14.5 oz (74.8 kg)     Height 09/18/24 1208 5' 10 (1.778 m)     Head Circumference --  Peak Flow --      Pain Score 09/18/24 1206 8     Pain Loc --      Pain Education --      Exclude from Growth Chart --    No data found.  Updated Vital Signs BP 121/77 (BP Location: Right Arm)   Pulse 83   Temp 97.8 F (36.6 C) (Oral)   Resp 18   Ht 5' 10 (1.778 m)   Wt 164 lb 14.5 oz (74.8 kg)   SpO2 98%   BMI 23.66 kg/m   Visual Acuity Right Eye Distance:   Left Eye Distance:   Bilateral Distance:    Right Eye Near:   Left Eye Near:    Bilateral Near:     Physical Exam Vitals and nursing note reviewed.  Constitutional:      General: He is not in acute distress.    Appearance: He is well-developed.  HENT:     Head: Normocephalic and atraumatic.  Eyes:     Conjunctiva/sclera: Conjunctivae normal.  Cardiovascular:     Rate and Rhythm: Normal rate and regular rhythm.     Heart sounds: No murmur heard. Pulmonary:     Effort: Pulmonary effort is normal. No respiratory distress.   Musculoskeletal:        General: No swelling.       Hands:     Cervical back: Neck supple.  Skin:    General: Skin is warm and dry.     Capillary Refill: Capillary refill takes less than 2 seconds.  Neurological:     Mental Status: He is alert.  Psychiatric:        Mood and Affect: Mood normal.      UC Treatments / Results  Labs (all labs ordered are listed, but only abnormal results are displayed) Labs Reviewed - No data to display  EKG   Radiology DG Finger Ring Left Result Date: 09/18/2024 EXAM: 3 VIEW(S) XRAY OF THE FINGER(S) 09/18/2024 12:31:33 PM COMPARISON: None available. CLINICAL HISTORY: Pain. Injury. Swelling. FINDINGS: BONES AND JOINTS: No acute fracture. No focal osseous lesion. No joint dislocation. SOFT TISSUES: Mild soft tissue edema in the region of the 4th distal phalanx. IMPRESSION: 1. Mild soft tissue edema of the 4th distal phalanx without underlying bone abnormality. Electronically signed by: Waddell Calk MD 09/18/2024 12:52 PM EST RP Workstation: [id number]    Procedures Procedures (including critical care time)  Medications Ordered in UC Medications - No data to display  Initial Impression / Assessment and Plan / UC Course  I have reviewed the triage vital signs and the nursing notes.  Pertinent labs & imaging results that were available during my care of the patient were reviewed by me and considered in my medical decision making (see chart for details).     Finger injury, left, initial encounter   X-ray of the left ring finger done today.  Final evaluation by the radiologist does not show any acute fracture.  There is still persistent swelling and pain in the area therefore we will immobilize the finger for the next 2 days and limit activity with it.  After this you can increase activity as tolerated.  Can use ibuprofen 600 to 800 mg every 8 hours as needed for pain. Ice the area 2-3 times daily for 10-15 minutes to help with pain and swelling.  Do not apply ice directly to the skin.  May return to urgent care as needed  Final Clinical Impressions(s) / UC Diagnoses  Final diagnoses:  Finger injury, left, initial encounter     Discharge Instructions      X-ray of the left ring finger done today.  Final evaluation by the radiologist does not show any acute fracture.  There is still persistent swelling and pain in the area therefore we will immobilize the finger for the next 2 days and limit activity with it.  After this you can increase activity as tolerated.  Can use ibuprofen 600 to 800 mg every 8 hours as needed for pain. Ice the area 2-3 times daily for 10-15 minutes to help with pain and swelling. Do not apply ice directly to the skin.  May return to urgent care as needed    ED Prescriptions   None    PDMP not reviewed this encounter.   Teresa Almarie LABOR, [place] 09/18/24 1305

## 2024-09-18 NOTE — Discharge Instructions (Addendum)
 X-ray of the left ring finger done today.  Final evaluation by the radiologist does not show any acute fracture.  There is still persistent swelling and pain in the area therefore we will immobilize the finger for the next 2 days and limit activity with it.  After this you can increase activity as tolerated.  Can use ibuprofen 600 to 800 mg every 8 hours as needed for pain. Ice the area 2-3 times daily for 10-15 minutes to help with pain and swelling. Do not apply ice directly to the skin.  May return to urgent care as needed

## 2024-09-18 NOTE — ED Triage Notes (Signed)
 Patient reports smashing finger (left hand, 4th digit) underneath washer/dryer when moving it recently a few days ago. Swelling, Pain since injury.
